# Patient Record
Sex: Male | Born: 1975 | Race: White | Hispanic: No | Marital: Married | Smoking: Current every day smoker
Health system: Southern US, Community
[De-identification: ages and names within clinical notes are randomized; demographics above are authoritative.]

## PROBLEM LIST (undated history)

## (undated) HISTORY — PX: NO PAST SURGERIES: SHX2092

---

## 2010-02-10 ENCOUNTER — Emergency Department: Payer: Self-pay | Admitting: Emergency Medicine

## 2013-05-23 ENCOUNTER — Ambulatory Visit: Payer: Self-pay

## 2016-11-04 ENCOUNTER — Ambulatory Visit (INDEPENDENT_AMBULATORY_CARE_PROVIDER_SITE_OTHER): Payer: No Typology Code available for payment source

## 2016-11-04 ENCOUNTER — Ambulatory Visit
Admission: EM | Admit: 2016-11-04 | Discharge: 2016-11-04 | Disposition: A | Discharge status: Home / Self Care | Payer: No Typology Code available for payment source | Attending: Emergency Medicine | Admitting: Emergency Medicine

## 2016-11-04 DIAGNOSIS — R059 Cough, unspecified: Secondary | ICD-10-CM

## 2016-11-04 DIAGNOSIS — R69 Illness, unspecified: Secondary | ICD-10-CM

## 2016-11-04 DIAGNOSIS — R05 Cough: Secondary | ICD-10-CM

## 2016-11-04 DIAGNOSIS — J111 Influenza due to unidentified influenza virus with other respiratory manifestations: Secondary | ICD-10-CM

## 2016-11-04 LAB — RAPID INFLUENZA A&B ANTIGENS
Influenza A (ARMC): NEGATIVE
Influenza B (ARMC): NEGATIVE

## 2016-11-04 MED ORDER — ALBUTEROL SULFATE HFA 108 (90 BASE) MCG/ACT IN AERS: 1.0000 | INHALATION_SPRAY | Freq: Four times a day (QID) | RESPIRATORY_TRACT | 0 refills | Status: DC | PRN

## 2016-11-04 MED ORDER — IBUPROFEN 800 MG PO TABS: 800.0000 mg | ORAL_TABLET | Freq: Three times a day (TID) | ORAL | 0 refills | Status: DC

## 2016-11-04 MED ORDER — OSELTAMIVIR PHOSPHATE 75 MG PO CAPS: 75.0000 mg | ORAL_CAPSULE | Freq: Two times a day (BID) | ORAL | 0 refills | Status: DC

## 2016-11-04 MED ORDER — FLUTICASONE PROPIONATE 50 MCG/ACT NA SUSP: 2.0000 | Freq: Every day | NASAL | 0 refills | Status: DC

## 2016-11-04 MED ORDER — BENZONATATE 200 MG PO CAPS: 200.0000 mg | ORAL_CAPSULE | Freq: Three times a day (TID) | ORAL | 0 refills | Status: DC | PRN

## 2016-11-04 MED ORDER — HYDROCOD POLST-CPM POLST ER 10-8 MG/5ML PO SUER: 5.0000 mL | Freq: Two times a day (BID) | ORAL | 0 refills | Status: DC | PRN

## 2016-11-04 MED ORDER — AEROCHAMBER PLUS MISC: 2 refills | Status: DC

## 2016-11-04 NOTE — ED Provider Notes (Signed)
HPI  SUBJECTIVE:  Isaiah Hill is a 41 y.o. male who presents with body aches, headaches for the past 3 days. He reports nonproductive cough, chest soreness and achiness from the cough. He reports shortness of breath, and states upper abdomen is sore from all the coughing. He reports clear nasal congestion, difficulty sleeping at night secondary to the cough. Reports watery, nonbloody diarrhea 7-8 episodes yesterday. He reports fevers Tmax 107.6 with a new thermometer, and 102 with an old oral thermometer. He tried to 650 mg of Tylenol and 400 mg of ibuprofen every 6 hours, NyQuil Robitussin. Symptoms are better when he eats, no aggravating factors. He denies wheezing, so sore throat, rhinorrhea, postnasal drip, sinus pain or pressure. No dental pain, ear pain, neck stiffness, photophobia. No nausea, vomiting. Slightly decreased appetite but is tolerating by mouth. No urinary complaints, rash, current skin infection. He is a former smoker. He has no other medical problems including lung disease, diabetes, hypertension or immunocompromised. PMD: Dr. Quillian Quince. He did not get a flu shot this year. No known contacts with the flu.    History reviewed. No pertinent past medical history.  Past Surgical History:  Procedure Laterality Date  . NO PAST SURGERIES      History reviewed. No pertinent family history.  Social History  Substance Use Topics  . Smoking status: Former Smoker    Quit date: 09/28/2016  . Smokeless tobacco: Former Neurosurgeon  . Alcohol use Yes     Comment: occasionally    No current facility-administered medications for this encounter.   Current Outpatient Prescriptions:  .  albuterol (PROVENTIL HFA;VENTOLIN HFA) 108 (90 Base) MCG/ACT inhaler, Inhale 1-2 puffs into the lungs every 6 (six) hours as needed for wheezing or shortness of breath., Disp: 1 Inhaler, Rfl: 0 .  benzonatate (TESSALON) 200 MG capsule, Take 1 capsule (200 mg total) by mouth 3 (three) times daily as needed for  cough., Disp: 20 capsule, Rfl: 0 .  chlorpheniramine-HYDROcodone (TUSSIONEX PENNKINETIC ER) 10-8 MG/5ML SUER, Take 5 mLs by mouth every 12 (twelve) hours as needed for cough., Disp: 120 mL, Rfl: 0 .  fluticasone (FLONASE) 50 MCG/ACT nasal spray, Place 2 sprays into both nostrils daily., Disp: 16 g, Rfl: 0 .  ibuprofen (ADVIL,MOTRIN) 800 MG tablet, Take 1 tablet (800 mg total) by mouth 3 (three) times daily., Disp: 30 tablet, Rfl: 0 .  oseltamivir (TAMIFLU) 75 MG capsule, Take 1 capsule (75 mg total) by mouth 2 (two) times daily. X 5 days, Disp: 10 capsule, Rfl: 0 .  Spacer/Aero-Holding Chambers (AEROCHAMBER PLUS) inhaler, Use as instructed, Disp: 1 each, Rfl: 2  No Known Allergies   ROS  As noted in HPI.   Physical Exam  BP 115/67 (BP Location: Left Arm)   Pulse (!) 55   Temp 98 F (36.7 C) (Oral)   Resp 17   Ht 6\' 5"  (1.956 m)   Wt 230 lb (104.3 kg)   SpO2 98%   BMI 27.27 kg/m   Constitutional: Well developed, well nourished, no acute distress Eyes: PERRL, EOMI, conjunctiva normal bilaterally HENT: Normocephalic, atraumatic,mucus membranes moist. Normal TMs, clear nasal congestion, erythematous turbinates, no sinus tenderness. Positive postnasal drip, oropharynx otherwise unremarkable. Neck: No meningismus. Positive shotty cervical lymphadenopathy Respiratory: Clear to auscultation bilaterally, no rales, no wheezing, no rhonchi positive mild chest wall tenderness Cardiovascular: Normal rate and rhythm, no murmurs, no gallops, no rubs GI: Soft, nondistended, normal bowel sounds, nontender, no rebound, no guarding. Negative McBurney, Eulah Pont, Rovsing,  Back: no CVAT  skin: No rash, skin intact Musculoskeletal:  no deformities Neurologic: Alert & oriented x 3, CN II-XII grossly intact, no motor deficits, sensation grossly intact Psychiatric: Speech and behavior appropriate   ED Course   Medications - No data to display  Orders Placed This Encounter  Procedures  . Rapid  Influenza A&B Antigens ([facility] only)    Standing Status:   Standing    Number of Occurrences:   1  . DG Chest 2 View    Standing Status:   Standing    Number of Occurrences:   1    Order Specific Question:   Reason for Exam (SYMPTOM  OR DIAGNOSIS REQUIRED)    Answer:   fever cough r/o PNA  . Droplet precaution    Standing Status:   Standing    Number of Occurrences:   1   Results for orders placed or performed during the hospital encounter of 11/04/16 (from the past 24 hour(s))  Rapid Influenza A&B Antigens ([facility] only)     Status: None   Collection Time: 11/04/16  9:00 AM  Result Value Ref Range   Influenza A ([facility]) NEGATIVE NEGATIVE   Influenza B ([facility]) NEGATIVE NEGATIVE   Dg Chest 2 View  Result Date: 11/04/2016 CLINICAL DATA:  41 year old male with history of cough and fever for the past 4 days. EXAM: CHEST  2 VIEW COMPARISON:  No priors. FINDINGS: Lung volumes are normal. No consolidative airspace disease. No pleural effusions. No pneumothorax. No pulmonary nodule or mass noted. Pulmonary vasculature and the cardiomediastinal silhouette are within normal limits. IMPRESSION: No radiographic evidence of acute cardiopulmonary disease. Electronically Signed   By: Trudie Reed M.D.   On: 11/04/2016 10:32    ED Clinical Impression  Influenza-like illness  Cough   ED Assessment/Plan  check chest x-ray to rule out pneumonia. His flu is negative.   Reviewed imaging independently. No apparent pneumonia. Normal chest x-ray per radiology. See radiology report for details.   Presentation most as necessary for influenza. Vitals are normal, but he did take ibuprofen within 5 hours prior to evaluation. Plan to send home with ibuprofen 800 mg 1 g of Tylenol 3 times a day, Tussionex, Tessalon, Tamiflu, Flonase, albuterol with spacer as he is a former smoker and he is reporting shortness of breath.  Mucinex D as needed for nasal congestion. Push fluids.  Discussed labs, imaging, MDM, plan  and followup with patient . Discussed sn/sx that should prompt return to the ED. Patient  agrees with plan.   Meds ordered this encounter  Medications  . ibuprofen (ADVIL,MOTRIN) 800 MG tablet    Sig: Take 1 tablet (800 mg total) by mouth 3 (three) times daily.    Dispense:  30 tablet    Refill:  0  . benzonatate (TESSALON) 200 MG capsule    Sig: Take 1 capsule (200 mg total) by mouth 3 (three) times daily as needed for cough.    Dispense:  20 capsule    Refill:  0  . chlorpheniramine-HYDROcodone (TUSSIONEX PENNKINETIC ER) 10-8 MG/5ML SUER    Sig: Take 5 mLs by mouth every 12 (twelve) hours as needed for cough.    Dispense:  120 mL    Refill:  0  . Spacer/Aero-Holding Chambers (AEROCHAMBER PLUS) inhaler    Sig: Use as instructed    Dispense:  1 each    Refill:  2  . albuterol (PROVENTIL HFA;VENTOLIN HFA) 108 (90 Base) MCG/ACT inhaler    Sig: Inhale 1-2 puffs into the  lungs every 6 (six) hours as needed for wheezing or shortness of breath.    Dispense:  1 Inhaler    Refill:  0  . oseltamivir (TAMIFLU) 75 MG capsule    Sig: Take 1 capsule (75 mg total) by mouth 2 (two) times daily. X 5 days    Dispense:  10 capsule    Refill:  0  . fluticasone (FLONASE) 50 MCG/ACT nasal spray    Sig: Place 2 sprays into both nostrils daily.    Dispense:  16 g    Refill:  0    *This clinic note was created using Scientist, clinical (histocompatibility and immunogenetics)Dragon dictation software. Therefore, there may be occasional mistakes despite careful proofreading.  ?   Domenick GongAshley Zahriah Roes, MD 11/04/16 1715

## 2016-11-04 NOTE — Discharge Instructions (Signed)
ibuprofen 800 mg 1 g of Tylenol 3 times a day, Tussionex, Tessalon, Tamiflu, Flonase, 1-2 puffs from your albuterol inhaler with spacer as needed for shortness of breath.  Mucinex D as needed for nasal congestion. Push fluids.

## 2016-11-04 NOTE — ED Triage Notes (Signed)
Patient complains of fever, cough, fatigue. Patient states that fever started on Friday PM and has remained constant. Patient states that he was able to get the fever to break today. Patient complains of body aches as well.

## 2016-11-04 NOTE — ED Notes (Signed)
X-ray called for transport 

## 2017-02-18 ENCOUNTER — Ambulatory Visit
Admission: EM | Admit: 2017-02-18 | Discharge: 2017-02-18 | Disposition: A | Discharge status: Home / Self Care | Payer: No Typology Code available for payment source | Attending: Family Medicine | Admitting: Family Medicine

## 2017-02-18 ENCOUNTER — Encounter: Payer: Self-pay | Admitting: Emergency Medicine

## 2017-02-18 DIAGNOSIS — S86891A Other injury of other muscle(s) and tendon(s) at lower leg level, right leg, initial encounter: Secondary | ICD-10-CM

## 2017-02-18 MED ORDER — MELOXICAM 15 MG PO TABS
15.0000 mg | ORAL_TABLET | Freq: Every day | ORAL | 0 refills | Status: DC
Start: 2017-02-18 — End: 2021-01-25

## 2017-02-18 NOTE — ED Triage Notes (Signed)
Patient c/o right lower leg pain that started on Saturday.  Patient denies injury or fall.

## 2017-02-18 NOTE — Discharge Instructions (Signed)
Read attached information sheet on shin splints

## 2017-02-18 NOTE — ED Provider Notes (Signed)
CSN: [id number]     Arrival date & time 02/18/17  0848 History   First MD Initiated Contact with Patient 02/18/17 1027     Chief Complaint  Patient presents with  . Leg Pain    right lower leg   (Consider location/radiation/quality/duration/timing/severity/associated sxs/prior Treatment) HPI  Is a 41 year old male who presents with right lower leg pain that started on Saturday. He does not remember any specific injury to his leg or foot. He states that his pain will radiate sometimes into his lateral foot and describes it as a burning pain. At rest is not having any difficulty but when he stands or walks his pain is worse. He is also noticed that any dorsiflexion of his foot causes significant pain along his shin which slightly lateral to the anterior spine. He does exercise but hasn't for about a week and a half. He does not relate any increase in the intensity duration surface or shoewear.        History reviewed. No pertinent past medical history. Past Surgical History:  Procedure Laterality Date  . NO PAST SURGERIES     History reviewed. No pertinent family history. Social History  Substance Use Topics  . Smoking status: Former Smoker    Quit date: 09/28/2016  . Smokeless tobacco: Former Neurosurgeon  . Alcohol use Yes     Comment: occasionally    Review of Systems  Constitutional: Positive for activity change. Negative for chills, fatigue and fever.  Musculoskeletal: Positive for gait problem and myalgias.  All other systems reviewed and are negative.   Allergies  Patient has no known allergies.  Home Medications   Prior to Admission medications   Medication Sig Start Date End Date Taking? Authorizing Provider  meloxicam (MOBIC) 15 MG tablet Take 1 tablet (15 mg total) by mouth daily. 02/18/17   Lutricia Feil, PA-C   Meds Ordered and Administered this Visit  Medications - No data to display  BP 138/66 (BP Location: Left Arm)   Pulse (!) 56   Temp 97.7 F (36.5 C)  (Oral)   Resp 16   Ht  (1.956 m)   Wt 215 lb (97.5 kg)   SpO2 100%   BMI 25.50 kg/m  No data found.   Physical Exam  Constitutional: He is oriented to person, place, and time. He appears well-developed and well-nourished. No distress.  HENT:  Head: Normocephalic and atraumatic.  Eyes: Pupils are equal, round, and reactive to light.  Neck: Normal range of motion.  Musculoskeletal: He exhibits tenderness. He exhibits no edema or deformity.  Admission of the right foot shows no ecchymosis erythema swelling or deformity. There is no induration or fluctuance. This is no crepitus present. Cessation is intact distally. Flexion is full and comfortable. Patient is unable to dorsiflex against resistance without having pain. Actively and passively he has pain that is sharply localized just lateral to the anterior and lateral to the tibial spine. Reproduces his symptoms.  Neurological: He is alert and oriented to person, place, and time.  Skin: Skin is warm and dry. He is not diaphoretic.  Psychiatric: He has a normal mood and affect. His behavior is normal. Judgment and thought content normal.  Nursing note and vitals reviewed.   Urgent Care Course     Procedures (including critical care time)  Labs Review Labs Reviewed - No data to display  Imaging Review No results found.   Visual Acuity Review  Right Eye Distance:   Left Eye Distance:  Bilateral Distance:    Right Eye Near:   Left Eye Near:    Bilateral Near:     Patient was fitted with a boot orthosis    MDM   1. Shin splint, right, initial encounter    Discharge Medication List as of 02/18/2017 10:51 AM    START taking these medications   Details  meloxicam (MOBIC) 15 MG tablet Take 1 tablet (15 mg total) by mouth daily., Starting Mon 02/18/2017, Normal      Plan: 1. Test/x-ray results and diagnosis reviewed with patient 2. rx as per orders; risks, benefits, potential side effects reviewed with patient 3.  Recommend supportive treatment with Icing the area 20 minutes out of every 2 hours 4 times daily. To enable the patient to be able to continue working in [employer] I will provide him with a boot orthosis providing much better protection and support while he is allowing it to heal. Recommend against participating in upcoming running ports that he has planned in 3 weeks. He will need to rest the area at least 4-6 weeks and then gradually increase his activities as tolerated. He is not improving he may return to our clinic or if given him the name and phone number of a local orthopedic group. 4. F/u prn if symptoms worsen or don't improve     Lutricia Feil, PA-C 02/18/17 1104

## 2017-10-29 IMAGING — CR DG CHEST 2V
3 series · 3 of 3 positions shown · non-contrast
Comparison: No priors.

CLINICAL DATA: 40-year-old male with history of cough and fever for
the past 4 days.

EXAM:
CHEST  2 VIEW

[chest pa (1 of 2)]
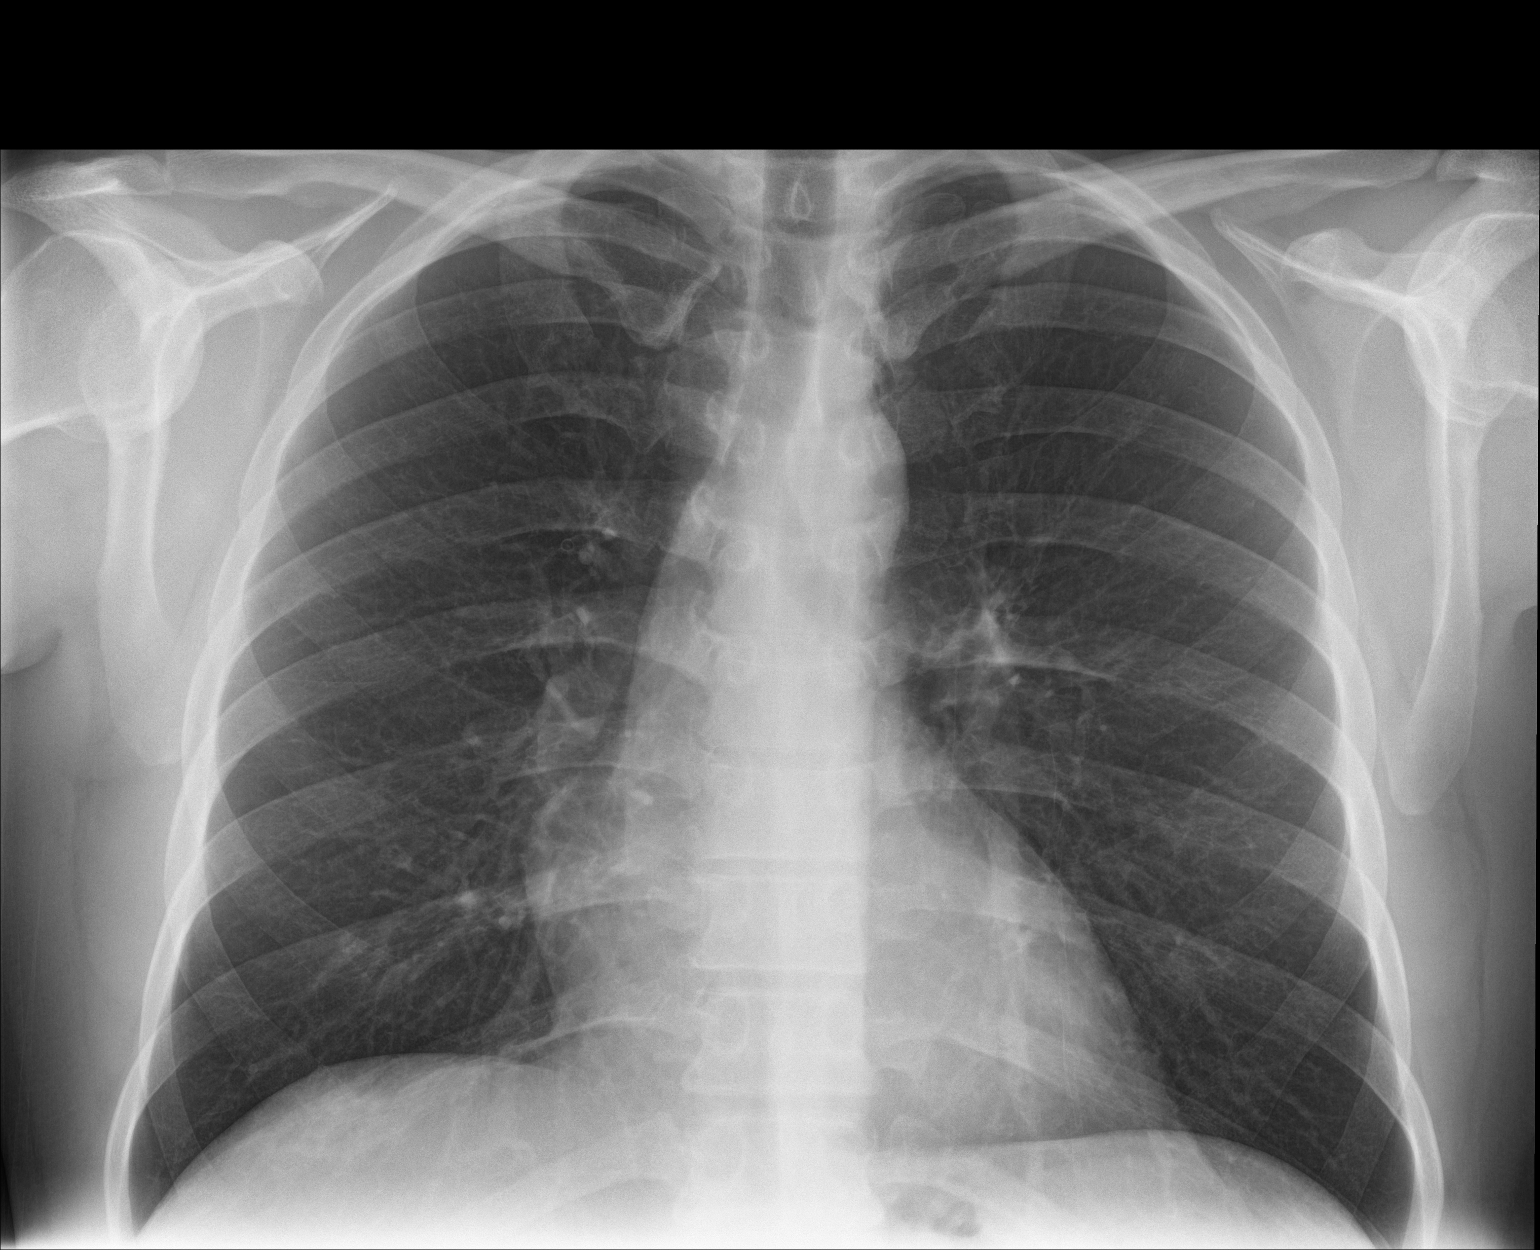

[chest lat]
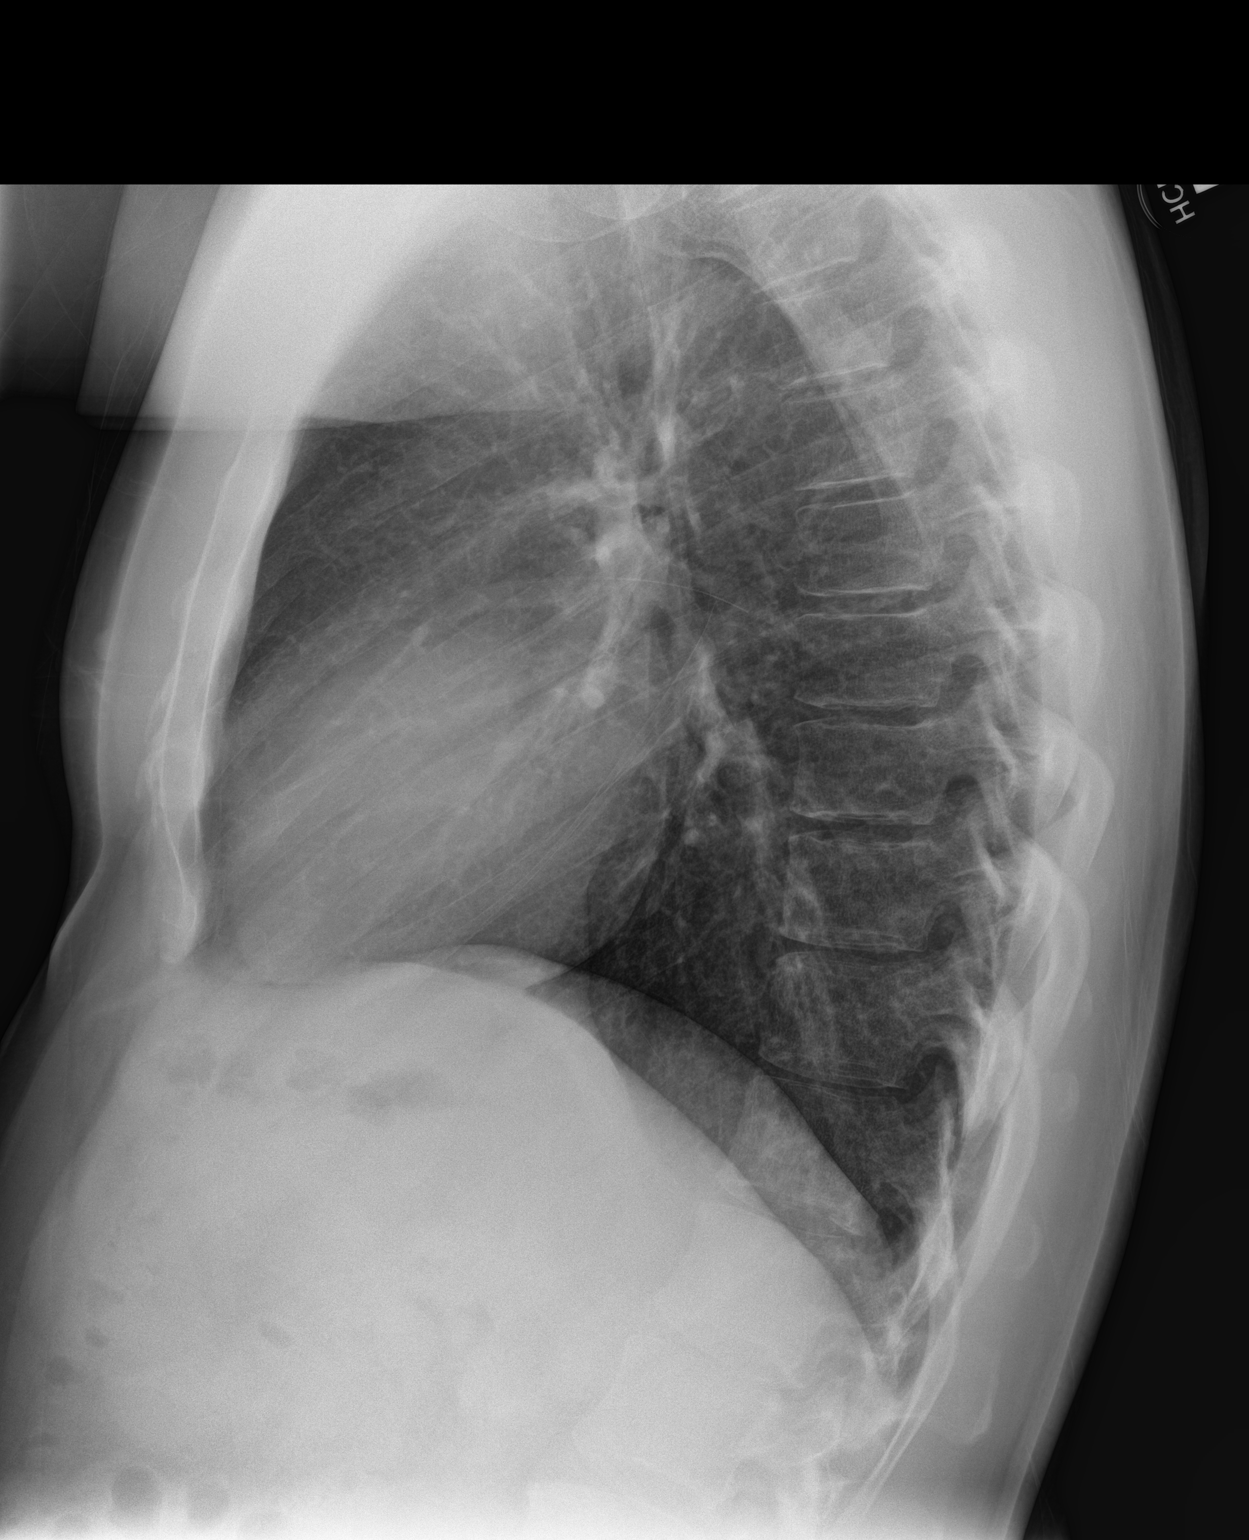

[chest pa (2 of 2)]
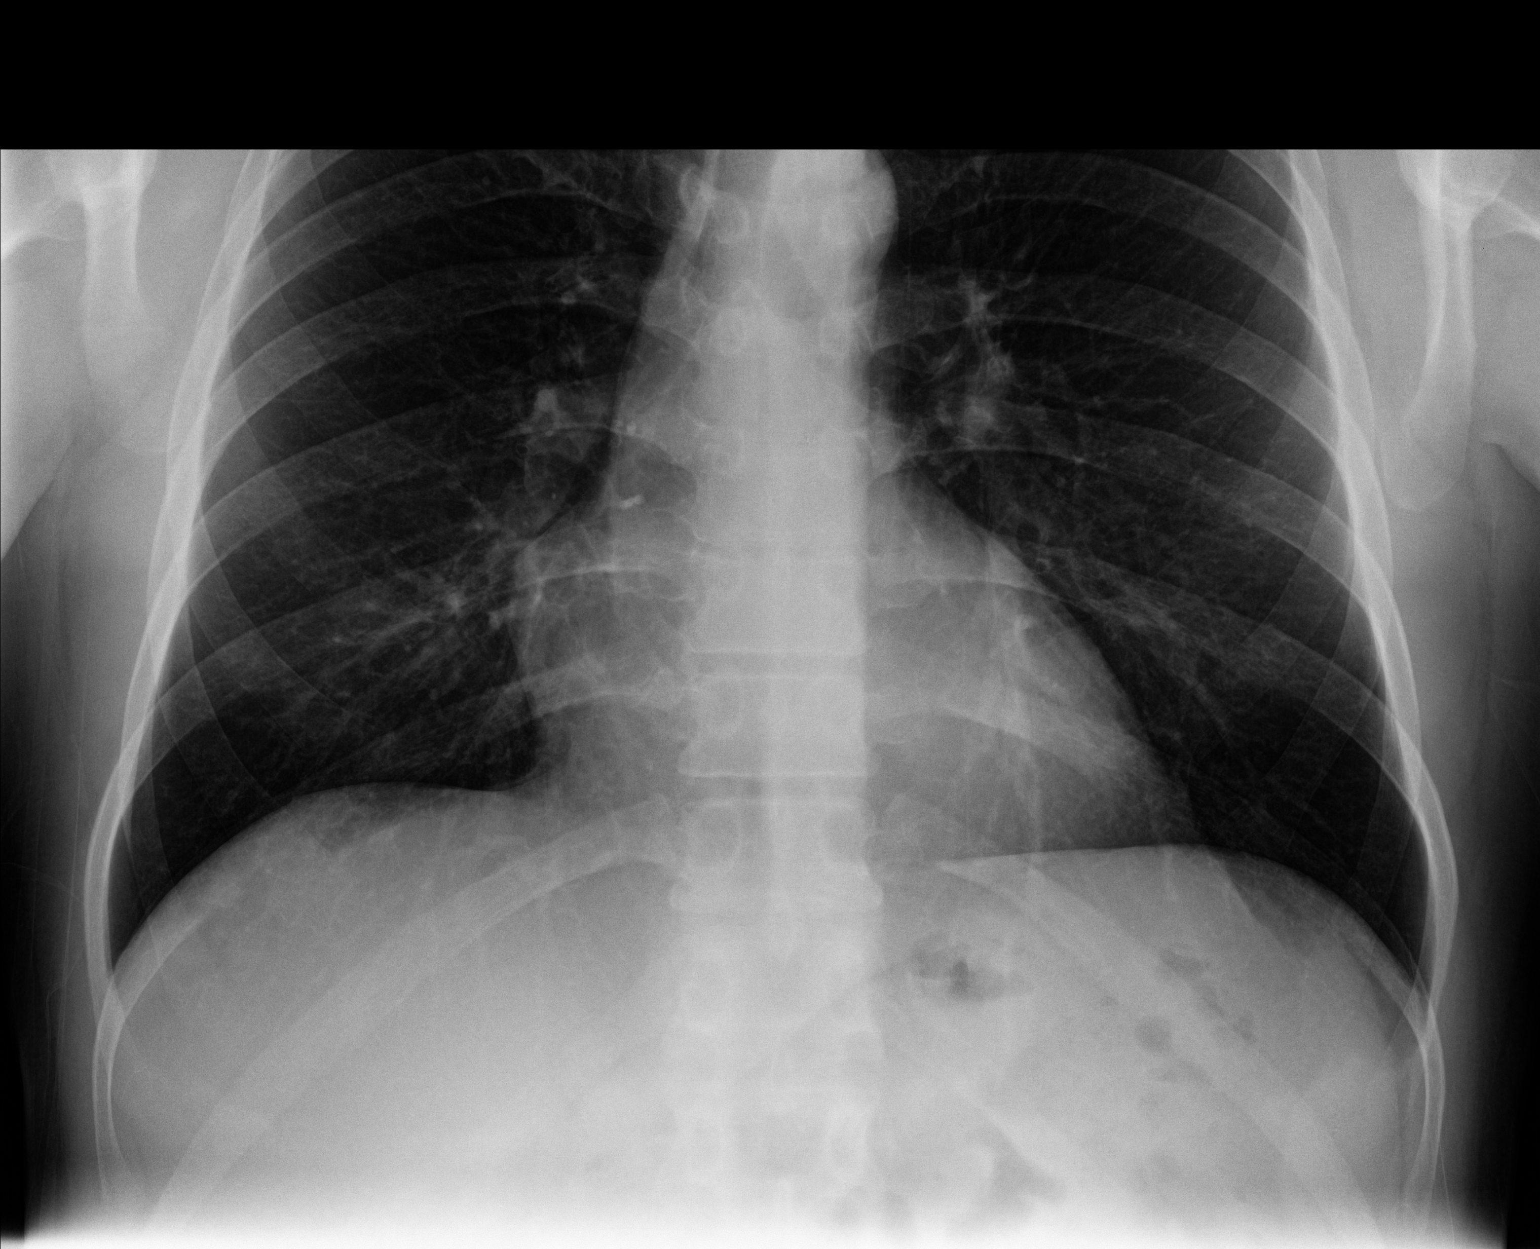

[3 of 3 positions shown; findings below may reference images not displayed]

FINDINGS: Lung volumes are normal. No consolidative airspace disease. No
pleural effusions. No pneumothorax. No pulmonary nodule or mass
noted. Pulmonary vasculature and the cardiomediastinal silhouette
are within normal limits.
IMPRESSION: No radiographic evidence of acute cardiopulmonary disease.

## 2021-01-25 ENCOUNTER — Encounter: Payer: Self-pay | Admitting: Gastroenterology

## 2021-01-25 ENCOUNTER — Ambulatory Visit (INDEPENDENT_AMBULATORY_CARE_PROVIDER_SITE_OTHER): Payer: No Typology Code available for payment source | Admitting: Gastroenterology

## 2021-01-25 ENCOUNTER — Other Ambulatory Visit: Payer: Self-pay

## 2021-01-25 VITALS — BP 121/74 | HR 64 | Temp 97.5°F | Ht 77.0 in | Wt 233.4 lb

## 2021-01-25 DIAGNOSIS — K625 Hemorrhage of anus and rectum: Secondary | ICD-10-CM | POA: Diagnosis not present

## 2021-01-25 NOTE — Progress Notes (Signed)
Gastroenterology Consultation  Referring Provider:     Dortha Kern, MD Primary Care Physician:  Dortha Kern, MD Primary Gastroenterologist:  Dr. Servando Snare     Reason for Consultation:     Hematochezia        HPI:   Isaiah Hill is a 45 y.o. y/o male referred for consultation & management of hematochezia by Dr. Quillian Quince, Doreene Nest, MD.  This patient comes to see me today after being seen in the past by Dr. Earlean Polka who is a gastroenterologist in [place].  At that time the patient had a colonoscopy that was reported to be done for a personal history of colon polyps.  The record of that procedure is not uploaded to care everywhere and I do not have access to that on his chart.  The preop diagnosis was also noted to state that the patient was having rectal bleeding at that time.  His most recent visit with his primary care provider was noted to have bright red blood per rectum that was intermittent. The patient reports that he has bright red blood with every bowel movement and intermittently has some blood with mucus.  The patient has been having these problems for over 5 years.  He is not have any constipation or unexplained weight loss.  He does not note any foods that make his symptoms any better or worse.  History reviewed. No pertinent past medical history.  Past Surgical History:  Procedure Laterality Date  . NO PAST SURGERIES      Prior to Admission medications   Medication Sig Start Date End Date Taking? Authorizing Provider  meloxicam (MOBIC) 15 MG tablet Take 1 tablet (15 mg total) by mouth daily. 02/18/17   Lutricia Feil, PA-C    History reviewed. No pertinent family history.   Social History   Tobacco Use  . Smoking status: Former Smoker    Quit date: 09/28/2016    Years since quitting: 4.3  . Smokeless tobacco: Former Engineer, water Use Topics  . Alcohol use: Yes    Comment: occasionally  . Drug use: No    Allergies as of 01/25/2021  . (No Known Allergies)    Review  of Systems:    All systems reviewed and negative except where noted in HPI.   Physical Exam:  BP 121/74   Pulse 64   Temp (!) 97.5 F (36.4 C) (Temporal)   Ht 6\' 5"  (1.956 m)   Wt 233 lb 6.4 oz (105.9 kg)   BMI 27.68 kg/m  No LMP for male patient. General:   Alert,  Well-developed, well-nourished, pleasant and cooperative in NAD Head:  Normocephalic and atraumatic. Eyes:  Sclera clear, no icterus.   Conjunctiva pink. Ears:  Normal auditory acuity. Neck:  Supple; no masses or thyromegaly. Lungs:  Respirations even and unlabored.  Clear throughout to auscultation.   No wheezes, crackles, or rhonchi. No acute distress. Heart:  Regular rate and rhythm; no murmurs, clicks, rubs, or gallops. Abdomen:  Normal bowel sounds.  No bruits.  Soft, non-tender and non-distended without masses, hepatosplenomegaly or hernias noted.  No guarding or rebound tenderness.  Negative Carnett sign.   Rectal:  Deferred.  Pulses:  Normal pulses noted. Extremities:  No clubbing or edema.  No cyanosis. Neurologic:  Alert and oriented x3;  grossly normal neurologically. Skin:  Intact without significant lesions or rashes.  No jaundice. Lymph Nodes:  No significant cervical adenopathy. Psych:  Alert and cooperative. Normal mood and affect.  Imaging Studies: No results found.  Assessment and Plan:   Isaiah Hill is a 45 y.o. y/o male who comes in today with rectal bleeding with almost every bowel movement and a colonoscopy that showed no sign of colitis or colon cancer.  The patient has a personal history of colon polyps but states that his last colonoscopy was negative and has been told that he should have a colonoscopy in 7 years after his last colonoscopy.  The patient will also be set up for hemorrhoidal banding with one of my partners because of the recurrent rectal bleeding.  The patient has also been told to increase fiber in his diet.  The patient has been explained the plan and agrees with  it.    Midge Minium, MD. Clementeen Graham    Note: This dictation was prepared with Dragon dictation along with smaller phrase technology. Any transcriptional errors that result from this process are unintentional.

## 2021-02-24 ENCOUNTER — Other Ambulatory Visit: Payer: Self-pay

## 2021-02-24 ENCOUNTER — Encounter: Payer: Self-pay | Admitting: Gastroenterology

## 2021-02-24 ENCOUNTER — Ambulatory Visit: Payer: No Typology Code available for payment source | Admitting: Gastroenterology

## 2021-02-24 VITALS — BP 132/78 | HR 65 | Temp 97.5°F | Ht 77.0 in | Wt 230.1 lb

## 2021-02-24 DIAGNOSIS — K625 Hemorrhage of anus and rectum: Secondary | ICD-10-CM | POA: Diagnosis not present

## 2021-02-24 DIAGNOSIS — K640 First degree hemorrhoids: Secondary | ICD-10-CM

## 2021-02-24 NOTE — Progress Notes (Signed)

## 2021-02-24 NOTE — Progress Notes (Signed)
   Arlyss Repress, MD [address]  [address]  [place], [place] [address]  Main: [phone]  Fax: [phone] Pager: [phone]   Primary Care Physician: Dortha Kern, MD  Primary Gastroenterologist:  Dr. Arlyss Repress  Chief Complaint  Patient presents with  . Hemorrhoids    Off and on rectal bleeding     HPI: Isaiah Hill is a 45 y.o. male has been experiencing painless rectal bleeding secondary to internal hemorrhoids for more than 5 years.  Patient underwent 2 colonoscopies for rectal bleeding, found to have polyps only.  He also reports rectal discomfort/swelling and sometimes itching.  He denies any prolapse.  His main concern is rectal bleeding and is desperate to be taken care of.  He does workout in the gym regularly, lifts up to 150 pounds about 2-3 times a week  No current outpatient medications on file.   No current facility-administered medications for this visit.    Allergies as of 02/24/2021  . (No Known Allergies)    NSAIDs: None  Antiplts/Anticoagulants/Anti thrombotics: None  GI procedures: Colonoscopy 07/2018, polyps which were benign He is not due for 7 years  ROS:  General: Negative for anorexia, weight loss, fever, chills, fatigue, weakness. ENT: Negative for hoarseness, difficulty swallowing , nasal congestion. CV: Negative for chest pain, angina, palpitations, dyspnea on exertion, peripheral edema.  Respiratory: Negative for dyspnea at rest, dyspnea on exertion, cough, sputum, wheezing.  GI: See history of present illness. GU:  Negative for dysuria, hematuria, urinary incontinence, urinary frequency, nocturnal urination.  Endo: Negative for unusual weight change.    Physical Examination:   BP 132/78 (BP Location: Left Arm, Patient Position: Sitting, Cuff Size: Large)   Pulse 65   Temp (!) 97.5 F (36.4 C) (Oral)   Ht 6\' 5"  (1.956 m)   Wt 230 lb 2 oz (104.4 kg)   BMI 27.29 kg/m   General: Well-nourished, well-developed in  no acute distress.  Eyes: No icterus. Conjunctivae pink. Mouth: Oropharyngeal mucosa moist and pink , no lesions erythema or exudate. Lungs: Clear to auscultation bilaterally. Non-labored. Heart: Regular rate and rhythm, no murmurs rubs or gallops.  Abdomen: Bowel sounds are normal, nontender, nondistended, no hepatosplenomegaly or masses, no hernia , no rebound or guarding.  Rectum: Small perianal skin tag, external hemorrhoids, anoscopy revealed internal and external hemorrhoids Extremities: No lower extremity edema. No clubbing or deformities. Neuro: Alert and oriented x 3.  Grossly intact. Skin: Warm and dry, no jaundice.   Psych: Alert and cooperative, normal mood and affect.   Imaging Studies: No results found.  Assessment and Plan:   Isaiah Hill is a 45 y.o. male with grade 1 internal hemorrhoids resulting in painless rectal bleeding  Discussed about hemorrhoid ligation, including risks and benefits of the procedure Consent obtained patient is willing to undergo hemorrhoid ligation today  Follow up in 2 to 3 weeks   Dr 49, MD

## 2021-03-16 ENCOUNTER — Other Ambulatory Visit: Payer: Self-pay

## 2021-03-16 ENCOUNTER — Ambulatory Visit: Payer: No Typology Code available for payment source | Admitting: Gastroenterology

## 2021-03-16 ENCOUNTER — Encounter: Payer: Self-pay | Admitting: Gastroenterology

## 2021-03-16 VITALS — BP 133/81 | HR 64 | Temp 97.9°F | Ht 77.0 in | Wt 228.0 lb

## 2021-03-16 DIAGNOSIS — K625 Hemorrhage of anus and rectum: Secondary | ICD-10-CM

## 2021-03-16 DIAGNOSIS — K640 First degree hemorrhoids: Secondary | ICD-10-CM | POA: Diagnosis not present

## 2021-03-16 NOTE — Progress Notes (Signed)

## 2021-04-05 ENCOUNTER — Encounter: Payer: Self-pay | Admitting: Gastroenterology

## 2021-04-05 ENCOUNTER — Ambulatory Visit: Payer: No Typology Code available for payment source | Admitting: Gastroenterology

## 2021-04-05 ENCOUNTER — Other Ambulatory Visit: Payer: Self-pay

## 2021-04-05 VITALS — BP 118/78 | HR 83 | Temp 97.8°F | Ht 77.0 in | Wt 223.2 lb

## 2021-04-05 DIAGNOSIS — K640 First degree hemorrhoids: Secondary | ICD-10-CM

## 2021-04-05 DIAGNOSIS — K625 Hemorrhage of anus and rectum: Secondary | ICD-10-CM

## 2021-04-05 NOTE — Progress Notes (Signed)

## 2021-04-06 ENCOUNTER — Telehealth: Payer: Self-pay | Admitting: Gastroenterology

## 2021-04-06 NOTE — Telephone Encounter (Signed)
Patient still having pain from yesterday's procedure.  Please call to advise.

## 2021-04-06 NOTE — Telephone Encounter (Signed)
Patient states he has a pressure rectal pain. He states he did have a bowel movement this morning but has felt like he has had to have a bowel movement all day. He states a couple times a day it has felt like the pain has taken his breath today. He states he has noticed he had a little trouble urinating today. He just took Motrin and it did help the pain for right now. Called Dr. Allegra Lai she recommended him to take Tylenol Extra strength. She said to rest today as much as he can and when he is lying down to elevate his feet. It should be better by tomorrow. Informed patient of this information and he verbalized understanding. He states he will call back tomorrow if he is not better

## 2022-10-21 ENCOUNTER — Ambulatory Visit
Admission: EM | Admit: 2022-10-21 | Discharge: 2022-10-21 | Disposition: A | Discharge status: Home / Self Care | Payer: Managed Care, Other (non HMO) | Attending: Emergency Medicine | Admitting: Emergency Medicine

## 2022-10-21 DIAGNOSIS — M795 Residual foreign body in soft tissue: Secondary | ICD-10-CM

## 2022-10-21 DIAGNOSIS — Z23 Encounter for immunization: Secondary | ICD-10-CM

## 2022-10-21 MED ORDER — TETANUS-DIPHTHERIA TOXOIDS TD 5-2 LFU IM INJ
0.5000 mL | INJECTION | Freq: Once | INTRAMUSCULAR | Status: AC
  Administered 2022-10-21: 0.5 mL via INTRAMUSCULAR

## 2022-10-21 NOTE — Discharge Instructions (Addendum)
You were seen for possible glass in hand and are being treated for the same.   -Your tetanus was updated today.. -Follow-up with an orthopedic or hand specialist in the very near future for further evaluation.  Take care, Dr. Sharlet Salina, NP-c

## 2022-10-21 NOTE — ED Triage Notes (Signed)
Pt c/o foreign object in left hand x30months  Pt states that he was holding a coffee pot and tripped and it shattered. Pt states that he wrapped it up and forgot about it but now has a lump in his left hand and it still irritates him.

## 2022-10-21 NOTE — ED Provider Notes (Signed)
[facility] - [place], [place]   Name: Isaiah Hill DOB: 12/24/1975 MRN: [id number] CSN: [id number] PCP: Dortha Kern, MD  Arrival date and time:  10/21/22 0804  Chief Complaint:  Foreign Body in Skin   NOTE: Prior to seeing the patient today, I have reviewed the triage nursing documentation and vital signs. Clinical staff has updated patient's PMH/PSHx, current medication list, and drug allergies/intolerances to ensure comprehensive history available to assist in medical decision making.   History:   HPI: Isaiah Hill is a 46 y.o. male who presents today with complaints of possible foreign body to left hand.  Patient fell with a glass coffee pot in his hand approximately 2 months ago.  He states he did not get evaluated after the event because it was not causing him too much pain.  He believed if it got infected, then he would know that there was glass in it. He is not presenting with worsening discomfort to the palm of his left hand and protrusion in his palm.  He states it is painful when medium pressure applied to the area.  He denies any warmth or drainage to the area.  Last tetanus was >5 years ago.   History reviewed. No pertinent past medical history.  Past Surgical History:  Procedure Laterality Date   NO PAST SURGERIES      History reviewed. No pertinent family history.  Social History   Tobacco Use   Smoking status: Every Day    Types: Cigarettes    Last attempt to quit: 09/28/2016    Years since quitting: 6.0   Smokeless tobacco: Former  Building services engineer Use: Never used  Substance Use Topics   Alcohol use: Yes    Comment: occasionally   Drug use: No    There are no problems to display for this patient.   Home Medications:    No outpatient medications have been marked as taking for the 10/21/22 encounter [facility] Encounter).    Allergies:   Patient has no known allergies.  Review of Systems (ROS): Review of Systems  Skin:         Protrusion to palm  All other systems reviewed and are negative.    Vital Signs: Today's Vitals   10/21/22 0821 10/21/22 0822  BP:  122/77  Pulse:  66  Resp:  18  Temp:  (!) 97.4 F (36.3 C)  TempSrc:  Oral  SpO2:  96%  Weight: 248 lb (112.5 kg)   Height: 6\' 5"  (1.956 m)   PainSc: 1      Physical Exam: Physical Exam Vitals and nursing note reviewed.  Constitutional:      Appearance: Normal appearance.  Cardiovascular:     Rate and Rhythm: Normal rate and regular rhythm.     Pulses: Normal pulses.     Heart sounds: Normal heart sounds.  Pulmonary:     Effort: Pulmonary effort is normal.     Breath sounds: Normal breath sounds.  Skin:    General: Skin is warm and dry.     Comments: Protrusion in between third and fourth fingers of the left palmar surface.  No erythema surrounding elevated area.  There is a well-healed incision below the protrusion.  Neurological:     General: No focal deficit present.     Mental Status: He is alert and oriented to person, place, and time.  Psychiatric:        Mood and Affect: Mood normal.  Behavior: Behavior normal.      Urgent Care Treatments / Results:   LABS: PLEASE NOTE: all labs that were ordered this encounter are listed, however only abnormal results are displayed. Labs Reviewed - No data to display  EKG: -None  RADIOLOGY: No results found.  PROCEDURES: Procedures  MEDICATIONS RECEIVED THIS VISIT: Medications  tetanus & diphtheria toxoids (adult) (TENIVAC) injection 0.5 mL (has no administration in time range)    PERTINENT CLINICAL COURSE NOTES/UPDATES:   Initial Impression / Assessment and Plan / Urgent Care Course:  Pertinent labs & imaging results that were available during my care of the patient were personally reviewed by me and considered in my medical decision making (see lab/imaging section of note for values and interpretations).  Isaiah Hill is a 46 y.o. male who presents to [facility]  today with complaints of possible foreign body, diagnosed with the same, and treated as such with the directions below. Patient's was updated today.  NP and patient reviewed discharge instructions below during visit.   Patient is well appearing overall in clinic today. He does not appear to be in any acute distress. Presenting symptoms (see HPI) and exam as documented above.   I have reviewed the follow up and strict return precautions for any new or worsening symptoms. Patient is aware of symptoms that would be deemed urgent/emergent, and would thus require further evaluation either here or in the emergency department. At the time of discharge, he verbalized understanding and consent with the discharge plan as it was reviewed with him. All questions were fielded by provider and/or clinic staff prior to patient discharge.    Final Clinical Impressions / Urgent Care Diagnoses:   Final diagnoses:  Foreign body (FB) in soft tissue    New Prescriptions:  [place] Controlled Substance Registry consulted? Not Applicable  Meds ordered this encounter  Medications   tetanus & diphtheria toxoids (adult) (TENIVAC) injection 0.5 mL      Discharge Instructions      You were seen for possible glass in hand and are being treated for the same.   -Your tetanus was updated today.. -Follow-up with an orthopedic or hand specialist in the very near future for further evaluation.  Take care, Dr. Sharlet Salina, NP-c     Recommended Follow up Care:  Patient encouraged to follow up with the following provider within the specified time frame, or sooner as dictated by the severity of his symptoms. As always, he was instructed that for any urgent/emergent care needs, he should seek care either here or in the emergency department for more immediate evaluation.   Bailey Mech, DNP, NP-c   Bailey Mech, NP 10/21/22 [phone]

## 2024-09-12 ENCOUNTER — Encounter: Payer: Self-pay | Admitting: Emergency Medicine

## 2024-09-12 ENCOUNTER — Ambulatory Visit
Admission: EM | Admit: 2024-09-12 | Discharge: 2024-09-12 | Disposition: A | Discharge status: Home / Self Care | Attending: Family Medicine | Admitting: Family Medicine

## 2024-09-12 DIAGNOSIS — B029 Zoster without complications: Secondary | ICD-10-CM | POA: Diagnosis not present

## 2024-09-12 MED ORDER — DOXYCYCLINE HYCLATE 100 MG PO CAPS: 100.0000 mg | ORAL_CAPSULE | Freq: Two times a day (BID) | ORAL | 0 refills | Status: AC

## 2024-09-12 MED ORDER — CLOBETASOL PROPIONATE 0.05 % EX CREA: 1.0000 | TOPICAL_CREAM | Freq: Two times a day (BID) | CUTANEOUS | 0 refills | Status: AC

## 2024-09-12 MED ORDER — METHYLPREDNISOLONE 4 MG PO TBPK: ORAL_TABLET | ORAL | 0 refills | Status: AC

## 2024-09-12 NOTE — ED Triage Notes (Signed)
 Patient c/o red itchy rash on his chest for 3-4 weeks.  Patient has not tried any OTC medicine or cream for the rash.  Patient denies fevers.

## 2024-09-16 NOTE — ED Provider Notes (Signed)
  [facility]   [id number] 09/12/24 Arrival Time: 0806  ASSESSMENT & PLAN:  1. Herpes zoster without complication    Suspect zoster; possible supra-infection.  Meds ordered this encounter  Medications   methylPREDNISolone (MEDROL DOSEPAK) 4 MG TBPK tablet    Sig: Take as directed.    Dispense:  1 each    Refill:  0   clobetasol cream (TEMOVATE) 0.05 %    Sig: Apply 1 Application topically 2 (two) times daily. For up to one week.    Dispense:  30 g    Refill:  0   doxycycline (VIBRAMYCIN) 100 MG capsule    Sig: Take 1 capsule (100 mg total) by mouth 2 (two) times daily.    Dispense:  14 capsule    Refill:  0   May f/u here as needed.  Will follow up with PCP or here if worsening or failing to improve as anticipated. Reviewed expectations re: course of current medical issues. Questions answered. Outlined signs and symptoms indicating need for more acute intervention. Patient verbalized understanding. After Visit Summary given.   SUBJECTIVE:  Isaiah Hill is a 48 y.o. male who presents with a skin complaint. Patient c/o red itchy rash on his chest for 3-4 weeks.  Patient has not tried any OTC medicine or cream for the rash.  Patient denies fevers. Rash is somewhat painful. No h/o zoster.   OBJECTIVE: Vitals:   09/12/24 0817 09/12/24 0819  BP:  134/78  Pulse:  (!) 51  Resp:  15  Temp:  98 F (36.7 C)  TempSrc:  Oral  SpO2:  95%  Weight: 106.6 kg   Height: 6' 5 (1.956 m)     General appearance: alert; no distress HEENT: [place]; AT Neck: supple with FROM Extremities: no edema; moves all extremities normally Skin: warm and dry;  crops of red/purplish papules over mid to central L chest; does cross over to R just a little; some areas with overlying yellowish crusting Psychological: alert and cooperative; normal mood and affect  No Known Allergies  History reviewed. No pertinent past medical history. Social History   Socioeconomic History   Marital  status: Married    Spouse name: Not on file   Number of children: Not on file   Years of education: Not on file   Highest education level: Not on file  Occupational History   Not on file  Tobacco Use   Smoking status: Every Day    Current packs/day: 0.00    Types: Cigarettes    Last attempt to quit: 09/28/2016    Years since quitting: 7.9   Smokeless tobacco: Former  Building Services Engineer status: Never Used  Substance and Sexual Activity   Alcohol use: Yes    Comment: occasionally   Drug use: No   Sexual activity: Not on file  Other Topics Concern   Not on file  Social History Narrative   Not on file   Social Drivers of Health   Financial Resource Strain: Not on file  Food Insecurity: Not on file  Transportation Needs: Not on file  Physical Activity: Not on file  Stress: Not on file  Social Connections: Not on file  Intimate Partner Violence: Not on file   History reviewed. No pertinent family history. Past Surgical History:  Procedure Laterality Date   NO PAST SURGERIES        Rolinda Rogue, MD 09/16/24 [phone]
